# Patient Record
Sex: Male | Born: 1960 | Race: White | Hispanic: No | Marital: Single | Smoking: Current every day smoker
Health system: Southern US, Community
[De-identification: ages and names within clinical notes are randomized; demographics above are authoritative.]

## PROBLEM LIST (undated history)

## (undated) ENCOUNTER — Emergency Department (HOSPITAL_BASED_OUTPATIENT_CLINIC_OR_DEPARTMENT_OTHER): Admission: EM | Payer: 59 | Source: Home / Self Care

## (undated) DIAGNOSIS — E785 Hyperlipidemia, unspecified: Secondary | ICD-10-CM

## (undated) DIAGNOSIS — E559 Vitamin D deficiency, unspecified: Secondary | ICD-10-CM

## (undated) HISTORY — PX: ANKLE SURGERY: SHX546

## (undated) HISTORY — PX: ESOPHAGEAL DILATION: SHX303

## (undated) HISTORY — DX: Vitamin D deficiency, unspecified: E55.9

## (undated) HISTORY — DX: Hyperlipidemia, unspecified: E78.5

## (undated) HISTORY — DX: Other disorders of iron metabolism: E83.19

---

## 2001-04-02 HISTORY — PX: CHOLECYSTECTOMY: SHX55

## 2005-04-02 HISTORY — PX: HIATAL HERNIA REPAIR: SHX195

## 2005-04-02 HISTORY — PX: DENTAL SURGERY: SHX609

## 2010-04-02 LAB — HM COLONOSCOPY

## 2014-09-21 LAB — PSA: PSA: 0.19

## 2015-01-19 LAB — LIPID PANEL
Cholesterol: 160 mg/dL (ref 0–200)
HDL: 44 mg/dL (ref 35–70)
LDL CALC: 101 mg/dL
TRIGLYCERIDES: 77 mg/dL (ref 40–160)

## 2015-01-19 LAB — CBC AND DIFFERENTIAL
HCT: 47 % (ref 41–53)
Hemoglobin: 17.2 g/dL (ref 13.5–17.5)
PLATELETS: 135 10*3/uL — AB (ref 150–399)
WBC: 4.9 10^3/mL

## 2015-01-19 LAB — BASIC METABOLIC PANEL
BUN: 17 mg/dL (ref 4–21)
Creatinine: 1 mg/dL (ref <=1.3)
Glucose: 93 mg/dL
Potassium: 4.4 mmol/L (ref 3.4–5.3)
Sodium: 139 mmol/L (ref 137–147)

## 2015-01-19 LAB — HEPATIC FUNCTION PANEL
ALK PHOS: 41 U/L (ref 25–125)
ALT: 33 U/L (ref 10–40)
AST: 24 U/L (ref 14–40)
BILIRUBIN, TOTAL: 0.6 mg/dL

## 2015-06-17 ENCOUNTER — Telehealth: Payer: Self-pay | Admitting: *Deleted

## 2015-06-17 ENCOUNTER — Encounter: Payer: Self-pay | Admitting: *Deleted

## 2015-06-17 NOTE — Telephone Encounter (Signed)
Pre-Visit Call completed with patient and chart updated.   Pre-Visit Info documented in Specialty Comments under [employer].    

## 2015-06-20 ENCOUNTER — Ambulatory Visit: Payer: Self-pay | Admitting: Family Medicine

## 2015-07-01 ENCOUNTER — Ambulatory Visit: Payer: Self-pay | Admitting: Family Medicine

## 2015-07-07 ENCOUNTER — Encounter: Payer: Self-pay | Admitting: Family Medicine

## 2015-07-07 ENCOUNTER — Telehealth: Payer: Self-pay

## 2015-07-07 ENCOUNTER — Ambulatory Visit (INDEPENDENT_AMBULATORY_CARE_PROVIDER_SITE_OTHER): Payer: 59 | Admitting: Family Medicine

## 2015-07-07 VITALS — BP 118/78 | HR 84 | Temp 98.1°F | Resp 16 | Ht 71.0 in | Wt 221.0 lb

## 2015-07-07 DIAGNOSIS — N529 Male erectile dysfunction, unspecified: Secondary | ICD-10-CM | POA: Diagnosis not present

## 2015-07-07 DIAGNOSIS — M792 Neuralgia and neuritis, unspecified: Secondary | ICD-10-CM | POA: Diagnosis not present

## 2015-07-07 DIAGNOSIS — E785 Hyperlipidemia, unspecified: Secondary | ICD-10-CM | POA: Insufficient documentation

## 2015-07-07 DIAGNOSIS — F17210 Nicotine dependence, cigarettes, uncomplicated: Secondary | ICD-10-CM

## 2015-07-07 DIAGNOSIS — Z720 Tobacco use: Secondary | ICD-10-CM

## 2015-07-07 LAB — BASIC METABOLIC PANEL WITH GFR
BUN: 15 mg/dL (ref 6–23)
CO2: 28 meq/L (ref 19–32)
Calcium: 9.6 mg/dL (ref 8.4–10.5)
Chloride: 103 meq/L (ref 96–112)
Creatinine, Ser: 1.04 mg/dL (ref 0.40–1.50)
GFR: 79.31 mL/min
Glucose, Bld: 94 mg/dL (ref 70–99)
Potassium: 4.2 meq/L (ref 3.5–5.1)
Sodium: 137 meq/L (ref 135–145)

## 2015-07-07 LAB — LIPID PANEL
Cholesterol: 175 mg/dL (ref 0–200)
HDL: 44.8 mg/dL
LDL Cholesterol: 109 mg/dL — ABNORMAL HIGH (ref 0–99)
NonHDL: 129.98
Total CHOL/HDL Ratio: 4
Triglycerides: 104 mg/dL (ref 0.0–149.0)
VLDL: 20.8 mg/dL (ref 0.0–40.0)

## 2015-07-07 LAB — HEPATIC FUNCTION PANEL
ALT: 29 U/L (ref 0–53)
AST: 21 U/L (ref 0–37)
Albumin: 4.5 g/dL (ref 3.5–5.2)
Alkaline Phosphatase: 36 U/L — ABNORMAL LOW (ref 39–117)
Bilirubin, Direct: 0.1 mg/dL (ref 0.0–0.3)
Total Bilirubin: 0.6 mg/dL (ref 0.2–1.2)
Total Protein: 7.3 g/dL (ref 6.0–8.3)

## 2015-07-07 LAB — CBC WITH DIFFERENTIAL/PLATELET
Basophils Absolute: 0 10*3/uL (ref 0.0–0.1)
Basophils Relative: 0.4 % (ref 0.0–3.0)
Eosinophils Absolute: 0.3 10*3/uL (ref 0.0–0.7)
Eosinophils Relative: 5.1 % — ABNORMAL HIGH (ref 0.0–5.0)
HCT: 49.7 % (ref 39.0–52.0)
Hemoglobin: 17.3 g/dL — ABNORMAL HIGH (ref 13.0–17.0)
Lymphocytes Relative: 44.8 % (ref 12.0–46.0)
Lymphs Abs: 2.4 10*3/uL (ref 0.7–4.0)
MCHC: 34.7 g/dL (ref 30.0–36.0)
MCV: 94.4 fl (ref 78.0–100.0)
Monocytes Absolute: 0.4 10*3/uL (ref 0.1–1.0)
Monocytes Relative: 7.4 % (ref 3.0–12.0)
Neutro Abs: 2.3 10*3/uL (ref 1.4–7.7)
Neutrophils Relative %: 42.3 % — ABNORMAL LOW (ref 43.0–77.0)
Platelets: 147 10*3/uL — ABNORMAL LOW (ref 150.0–400.0)
RBC: 5.27 Mil/uL (ref 4.22–5.81)
RDW: 13.2 % (ref 11.5–15.5)
WBC: 5.4 10*3/uL (ref 4.0–10.5)

## 2015-07-07 LAB — IBC PANEL
Iron: 152 ug/dL (ref 42–165)
Saturation Ratios: 41.6 % (ref 20.0–50.0)
Transferrin: 261 mg/dL (ref 212.0–360.0)

## 2015-07-07 LAB — TSH: TSH: 2 u[IU]/mL (ref 0.35–4.50)

## 2015-07-07 MED ORDER — TADALAFIL 5 MG PO TABS: 5.0000 mg | ORAL_TABLET | Freq: Every day | ORAL | Status: DC | PRN

## 2015-07-07 NOTE — Assessment & Plan Note (Signed)
New to provider, ongoing for pt.  Not currently having serial phlebotomy.  Reports he was told to follow a low carb, low fat diet by Hematology.  Check labs and determine if further intervention is needed

## 2015-07-07 NOTE — Progress Notes (Signed)
   Subjective:    Patient ID: Daniel Yang List, male    DOB: 1960/11/02, 55 y.o.   MRN: [id number]  HPI New to establish.  Previous MD- Littie DeedsGentry  UTD on colonoscopy- done 2013 by Dr Brennan BaileyMark Smith  ED- chronic problem, on Cialis but this is very expensive for pt.  Took Viagra in the past but Cialis is much more effective.  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  On Gabapentin 3 tabs as needed.  Pt reports good relief w/ sxs w/ the medication.  Hemochromatosis- chronic problem, pt has hx of elevated iron.  Per report 'was up to 577'.  Pt saw a hematologist who 'put me on a low carb, low fat diet' which caused levels to 'drop 50 points'.  Pt is now donating blood through the [facility].  Denies dizziness, nausea.  Did have 'a spell with my vision' but has an eye exam on 4/11.  Hyperlipidemia- noted in previous records.  Pt has never been on medication.  Denies CP, SOB, HAs, abd pain, N/V.  Tobacco use- smoking a pack daily for 35 yrs.   Review of Systems For ROS see HPI     Objective:   Physical Exam  Constitutional: He is oriented to person, place, and time. He appears well-developed and well-nourished. No distress.  HENT:  Head: Normocephalic and atraumatic.  Eyes: Conjunctivae and EOM are normal. Pupils are equal, round, and reactive to light.  Neck: Normal range of motion. Neck supple. No thyromegaly present.  Cardiovascular: Normal rate, regular rhythm, normal heart sounds and intact distal pulses.   No murmur heard. Pulmonary/Chest: Effort normal and breath sounds normal. No respiratory distress.  Abdominal: Soft. Bowel sounds are normal. He exhibits no distension.  Musculoskeletal: He exhibits no edema.  Lymphadenopathy:    He has no cervical adenopathy.  Neurological: He is alert and oriented to person, place, and time. No cranial nerve deficit.  Skin: Skin is warm and dry.  Psychiatric: He has a normal mood and affect. His behavior is normal.  Vitals reviewed.         Assessment & Plan:

## 2015-07-07 NOTE — Assessment & Plan Note (Signed)
New to provider, ongoing for pt.  He reports sxs are bilateral in his arms and occur mostly at night while lying down but he gets near complete relief w/ Gabapentin PRN.  No further work up at this time

## 2015-07-07 NOTE — Patient Instructions (Signed)
Schedule your complete physical in 6 months We'll notify you of your lab results and make any changes if needed Try and quit smoking!!! Continue to make healthy food choices and get regular exercise- you can do it! We'll try and get you a better deal on Cialis Call with any questions or concerns Welcome!  We're glad to have you!!!

## 2015-07-07 NOTE — Telephone Encounter (Signed)
Spoke with Josh at [facility] [phone], Cialis 5 mg tablets are denied, there is nothing on formulary, all ED drugs require P.A. , chance of getting approved is not likely as approval normally only happens with diagnoses BPH, informed Dr. Beverely Lowabori of denial

## 2015-07-07 NOTE — Assessment & Plan Note (Signed)
New to provider, ongoing for pt.  He has been a 1 ppd smoker x35 yrs.  Encouraged him to quit- he states he will try.

## 2015-07-07 NOTE — Progress Notes (Signed)
Pre visit review using our clinic review tool, if applicable. No additional management support is needed unless otherwise documented below in the visit note. 

## 2015-07-07 NOTE — Assessment & Plan Note (Signed)
New to provider, noted in previous PCPs chart.  Pt was unaware of this.  Check labs.  Start meds prn.

## 2015-07-07 NOTE — Assessment & Plan Note (Signed)
New to provider, ongoing for pt.  He reports the medication is very expensive but works better than Viagra (which he was on previously).  Will refill and provided coupon for him.  Will follow.

## 2015-07-08 ENCOUNTER — Encounter: Payer: Self-pay | Admitting: General Practice

## 2015-07-11 ENCOUNTER — Other Ambulatory Visit: Payer: Self-pay | Admitting: General Practice

## 2015-07-11 MED ORDER — TADALAFIL 5 MG PO TABS: 5.0000 mg | ORAL_TABLET | Freq: Every day | ORAL | Status: AC | PRN

## 2015-07-11 MED FILL — CIALIS 5 MG TABLET: 5 | 30 days supply | Qty: 30 | Fill #0

## 2015-07-12 ENCOUNTER — Telehealth: Payer: Self-pay | Admitting: Family Medicine

## 2015-07-12 NOTE — Telephone Encounter (Signed)
Pt calling for lab results.

## 2015-07-12 NOTE — Telephone Encounter (Signed)
Called pt and LMOVM to return call.  °

## 2015-07-13 ENCOUNTER — Telehealth: Payer: Self-pay | Admitting: General Practice

## 2015-07-13 ENCOUNTER — Encounter: Payer: Self-pay | Admitting: General Practice

## 2015-07-13 DIAGNOSIS — G453 Amaurosis fugax: Secondary | ICD-10-CM

## 2015-07-13 DIAGNOSIS — K4090 Unilateral inguinal hernia, without obstruction or gangrene, not specified as recurrent: Secondary | ICD-10-CM

## 2015-07-13 NOTE — Telephone Encounter (Signed)
Spoke with Dr. Iran PlanasJames Pope with [facility] in [place], [place]. He advised that patient needs to have carotid studies completed to rule out Amarosis Fugax vs. Opthalmic Migraine. Their office advised that this was not an urgent request. Please advise on the imaging you want completed.   Fax results to [phone]

## 2015-07-13 NOTE — Telephone Encounter (Signed)
Pt informed of the results and expressed an understanding.

## 2015-07-18 ENCOUNTER — Ambulatory Visit (HOSPITAL_BASED_OUTPATIENT_CLINIC_OR_DEPARTMENT_OTHER)
Admission: RE | Admit: 2015-07-18 | Discharge: 2015-07-18 | Disposition: A | Discharge status: Home / Self Care | Payer: 59 | Source: Ambulatory Visit | Attending: Family Medicine | Admitting: Family Medicine

## 2015-07-18 DIAGNOSIS — G453 Amaurosis fugax: Secondary | ICD-10-CM | POA: Diagnosis not present

## 2015-07-18 DIAGNOSIS — I6523 Occlusion and stenosis of bilateral carotid arteries: Secondary | ICD-10-CM | POA: Insufficient documentation

## 2015-07-18 NOTE — Telephone Encounter (Signed)
Ok to get bilateral carotid dopplers (dx Amarosis Fugax)

## 2015-07-18 NOTE — Telephone Encounter (Signed)
Referral placed.

## 2015-07-18 NOTE — Telephone Encounter (Signed)
Ok to refer to surgery for evaluation of possible R inguinal hernia

## 2015-07-18 NOTE — Telephone Encounter (Signed)
Pt notified and prefers that the carotids be completed at the med center.   Also pt states that he feels as though he has a hernia coming up on the right side. He previously underwent hernia repair on the left side. Please advise   Dr. Brennan BaileyMark Smith in [place]

## 2015-07-19 ENCOUNTER — Encounter: Payer: Self-pay | Admitting: Family Medicine

## 2015-08-18 MED FILL — CIALIS 5 MG TABLET: 5 | 30 days supply | Qty: 30 | Fill #1

## 2015-09-12 LAB — HM COLONOSCOPY

## 2015-09-14 ENCOUNTER — Encounter: Payer: Self-pay | Admitting: General Practice

## 2015-09-22 MED FILL — OMEPRAZOLE DR 20 MG CAPSULE: 20 | 30 days supply | Qty: 30 | Fill #0

## 2015-10-12 ENCOUNTER — Telehealth: Payer: Self-pay | Admitting: Family Medicine

## 2015-10-12 ENCOUNTER — Telehealth: Payer: Self-pay | Admitting: General Practice

## 2015-10-12 NOTE — Telephone Encounter (Signed)
Called pt and advised that we do not have any cialis coupons in the office but he can print them offline. Pt stated an agreement and will look when he gets home.

## 2015-10-12 NOTE — Telephone Encounter (Signed)
Pt calling asking if he could get another coupon for cialis

## 2015-10-12 NOTE — Telephone Encounter (Signed)
PA for Cialis began today with Cover my meds (key #[id number])

## 2015-10-17 ENCOUNTER — Encounter: Payer: Self-pay | Admitting: Family Medicine

## 2015-10-19 NOTE — Telephone Encounter (Signed)
Nothing to do if pt does not have BPH sxs

## 2015-10-19 NOTE — Telephone Encounter (Signed)
FYI.   PA for Cialis was denied, [employer] called today and was unable to give me any alternative therapy for patient. They stated pt wanted to proceed with appeals process but also advised that as long as pt does not have a diagnosis of BPH any medications for Erectile Dysfunction would not be approved. [employer] representative states he was going to advise pt of this response.

## 2015-10-25 MED FILL — OMEPRAZOLE DR 20 MG CAPSULE: 20 | 30 days supply | Qty: 30 | Fill #1

## 2015-11-11 ENCOUNTER — Encounter: Payer: Self-pay | Admitting: Family Medicine

## 2015-11-11 ENCOUNTER — Ambulatory Visit (HOSPITAL_BASED_OUTPATIENT_CLINIC_OR_DEPARTMENT_OTHER)
Admission: RE | Admit: 2015-11-11 | Discharge: 2015-11-11 | Disposition: A | Discharge status: Home / Self Care | Payer: 59 | Source: Ambulatory Visit | Attending: Family Medicine | Admitting: Family Medicine

## 2015-11-11 ENCOUNTER — Ambulatory Visit (INDEPENDENT_AMBULATORY_CARE_PROVIDER_SITE_OTHER): Payer: 59 | Admitting: Family Medicine

## 2015-11-11 ENCOUNTER — Encounter: Payer: Self-pay | Admitting: General Practice

## 2015-11-11 VITALS — BP 132/82 | HR 78 | Temp 98.1°F | Resp 16 | Ht 71.0 in | Wt 227.1 lb

## 2015-11-11 DIAGNOSIS — M25532 Pain in left wrist: Secondary | ICD-10-CM

## 2015-11-11 DIAGNOSIS — G2581 Restless legs syndrome: Secondary | ICD-10-CM | POA: Diagnosis not present

## 2015-11-11 DIAGNOSIS — M19032 Primary osteoarthritis, left wrist: Secondary | ICD-10-CM | POA: Insufficient documentation

## 2015-11-11 DIAGNOSIS — H9313 Tinnitus, bilateral: Secondary | ICD-10-CM

## 2015-11-11 MED ORDER — ROPINIROLE HCL 0.5 MG PO TABS: 0.5000 mg | ORAL_TABLET | Freq: Every day | ORAL | 3 refills | Status: AC

## 2015-11-11 MED FILL — rOPINIRole HCL 0.5 MG TABS: 0.5 | 30 days supply | Qty: 30 | Fill #0

## 2015-11-11 NOTE — Patient Instructions (Signed)
Follow up as needed [facility] [address] [facility] [address] and get your xray done Tylenol/ibuprofen as needed for pain, Ice for swelling and discomfort Start Claritin or Zyrtec daily to see if the ringing in the ears improve.  If no improvement after 2 weeks, please let me know so we can refer to ENT Start the Requip nightly for the restless legs.  Start w/ 1/2 tab nightly and if no improvement after 4 days, increase to the whole tab nightly Call with any questions or concerns Hang in there!!!

## 2015-11-11 NOTE — Progress Notes (Signed)
Pre visit review using our clinic review tool, if applicable. No additional management support is needed unless otherwise documented below in the visit note. 

## 2015-11-11 NOTE — Progress Notes (Signed)
   Subjective:    Patient ID: Daniel Yang, male    DOB: [birth year], 55 y.o.   MRN: [id number]  HPI Tinnitus- pt reports ringing in both ears for ~1 month.  Pt reports ringing is getting louder rather than improving.  No recent noise exposure or injury.  No associated dizziness.  No decreased hearing.  L wrist pain- pt got wrist 'slammed in door' on Tuesday AM.  Reports area is sore and swollen.  RLS- pt has painful legs at night which require him to get up an move.  Pt describes it as a 'squeezing' but it 'doesn't get hard like a leg cramp'.  sxs started a couple of years ago.   Review of Systems For ROS see HPI     Objective:   Physical Exam  Constitutional: He is oriented to person, place, and time. He appears well-developed and well-nourished. No distress.  HENT:  Head: Normocephalic and atraumatic.  No TTP over sinuses + turbinate edema + PND TMs normal bilaterally  Eyes: Conjunctivae and EOM are normal. Pupils are equal, round, and reactive to light.  Neck: Normal range of motion. Neck supple.  Cardiovascular: Normal rate, regular rhythm, normal heart sounds and intact distal pulses.   Pulmonary/Chest: Effort normal and breath sounds normal. No respiratory distress. He has no wheezes.  Musculoskeletal: He exhibits edema (mild swelling over L 1st CMC joint) and tenderness (TTP over L 1st CMC joint, TTP over snuffbox). He exhibits no deformity.  Lymphadenopathy:    He has no cervical adenopathy.  Neurological: He is alert and oriented to person, place, and time.  Skin: Skin is warm and dry.  Vitals reviewed.         Assessment & Plan:  Tinnitus- new.  Suspect this may be related to untreated seasonal allergies.  Start daily antihistamine.  If no improvement, refer to ENT for complete evaluation and tx.  Pt expressed understanding and is in agreement w/ plan.   L wrist pain- due to TTP over snuffbox, get imaging to assess.  Mechanism of injury is unlikely for fracture but  must be sure.  NSAIDs, ice for pain relief.  Reviewed supportive care and red flags that should prompt return.  Pt expressed understanding and is in agreement w/ plan.   RLS- pt's nocturnal leg sxs consistent w/ RLS.  Start low dose Requip and monitor for improvement.  Pt expressed understanding and is in agreement w/ plan.

## 2015-11-14 ENCOUNTER — Other Ambulatory Visit: Payer: Self-pay | Admitting: Family Medicine

## 2015-11-14 DIAGNOSIS — S62502A Fracture of unspecified phalanx of left thumb, initial encounter for closed fracture: Secondary | ICD-10-CM

## 2015-11-14 DIAGNOSIS — M25532 Pain in left wrist: Secondary | ICD-10-CM

## 2015-11-15 ENCOUNTER — Encounter: Payer: Self-pay | Admitting: Family Medicine

## 2015-11-17 MED FILL — MELOXICAM 15 MG TABLET: 15 | 30 days supply | Qty: 30 | Fill #0

## 2015-11-28 ENCOUNTER — Telehealth: Payer: Self-pay | Admitting: Family Medicine

## 2015-11-28 MED ORDER — GABAPENTIN 100 MG PO CAPS: ORAL_CAPSULE | ORAL | 3 refills | Status: AC

## 2015-11-28 MED FILL — GABAPENTIN 100 MG CAPSULE: 100 | 10 days supply | Qty: 90 | Fill #0

## 2015-11-28 NOTE — Telephone Encounter (Signed)
Pt needs refill on gabapentin, [facility].

## 2015-11-28 NOTE — Telephone Encounter (Signed)
Medication filled to pharmacy as requested.   

## 2015-11-29 MED FILL — OMEPRAZOLE DR 20 MG CAPSULE: 20 | 30 days supply | Qty: 30 | Fill #2

## 2016-01-24 MED FILL — rOPINIRole HCL 0.5 MG TABS: 0.5 | 30 days supply | Qty: 30 | Fill #1

## 2016-05-22 ENCOUNTER — Encounter: Payer: Self-pay | Admitting: General Practice

## 2016-05-30 MED FILL — rOPINIRole HCL 0.5 MG TABS: 0.5 | 30 days supply | Qty: 30 | Fill #2

## 2016-05-30 MED FILL — MELOXICAM 15 MG TABLET: 15 | 30 days supply | Qty: 30 | Fill #1

## 2016-05-30 MED FILL — GABAPENTIN 100 MG CAP: 100 | 10 days supply | Qty: 90 | Fill #1

## 2016-11-15 MED FILL — GABAPENTIN 100 MG CAPSULE: 100 | 10 days supply | Qty: 90 | Fill #2

## 2017-08-20 ENCOUNTER — Encounter: Payer: Self-pay | Admitting: General Practice

## 2018-07-06 IMAGING — DX DG WRIST COMPLETE 3+V*L*
4 series · 4 of 4 positions shown · non-contrast
Comparison: None.

CLINICAL DATA: Snuffbox pain in the left wrist after recent injury.

EXAM:
LEFT WRIST - COMPLETE 3+ VIEW

[wrist pa]
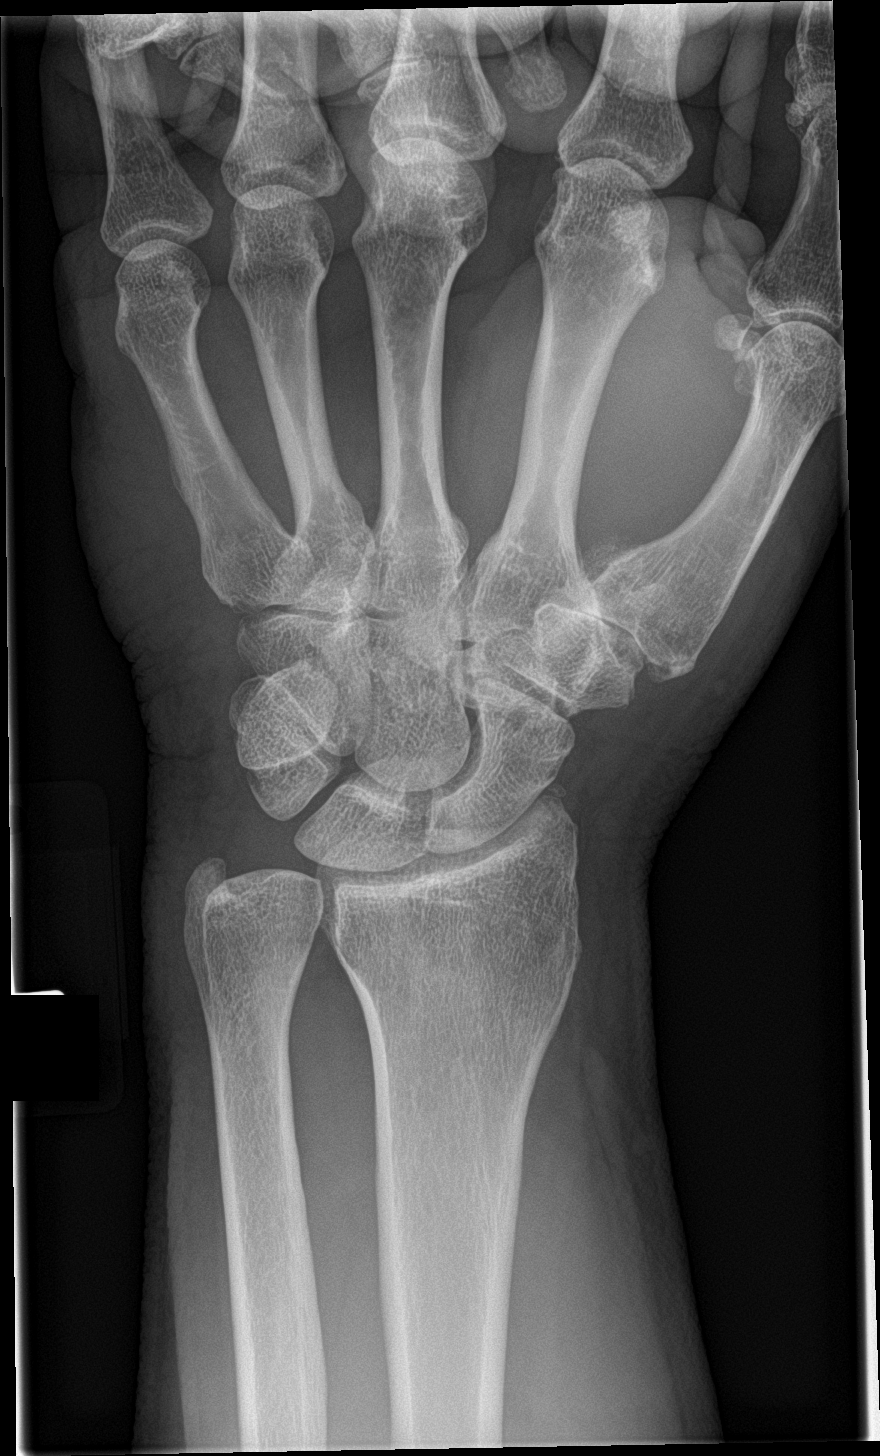

[wrist obl]
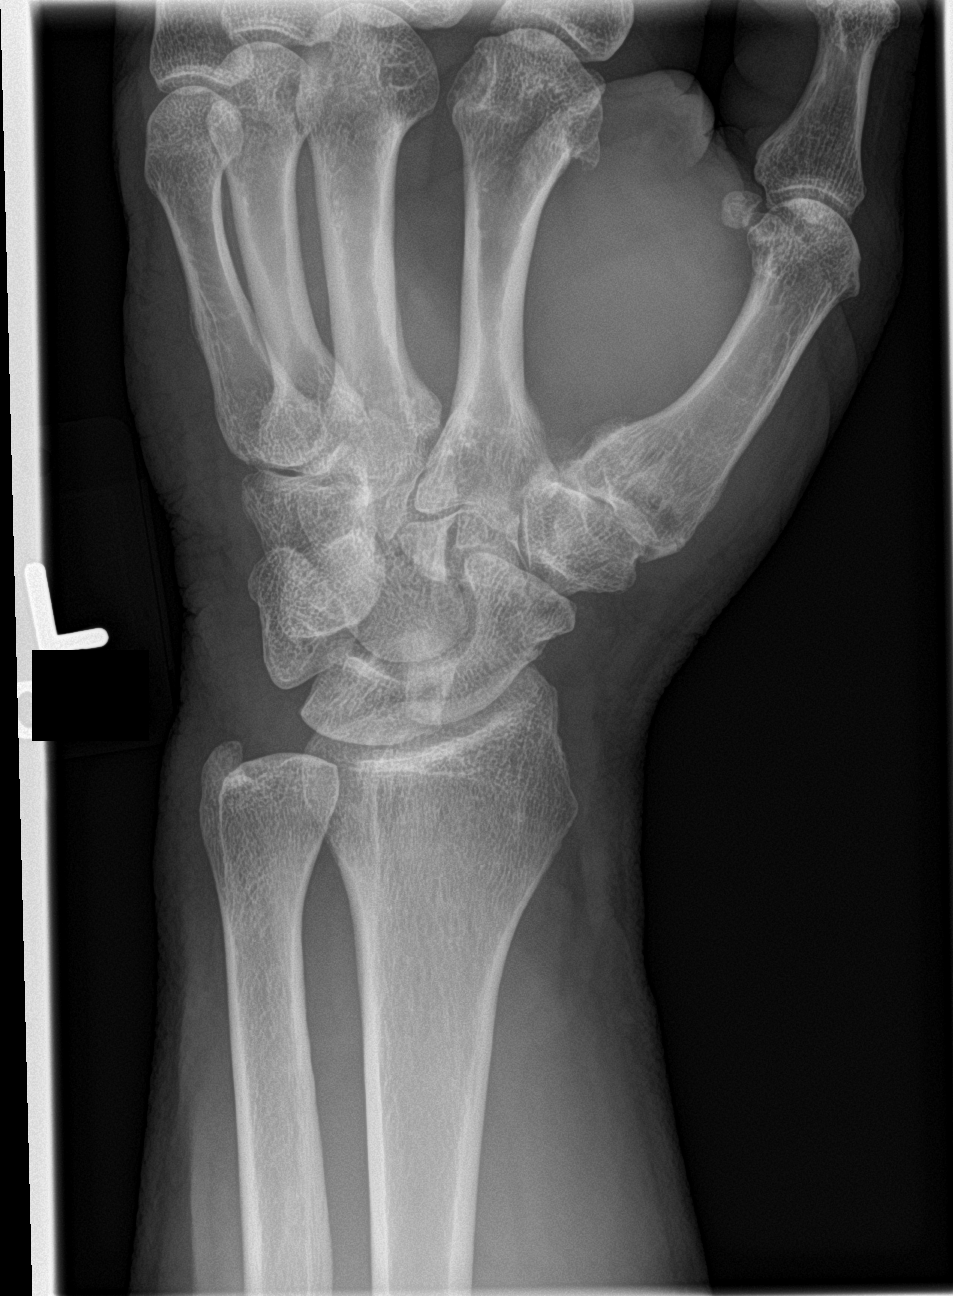

[wrist lat]
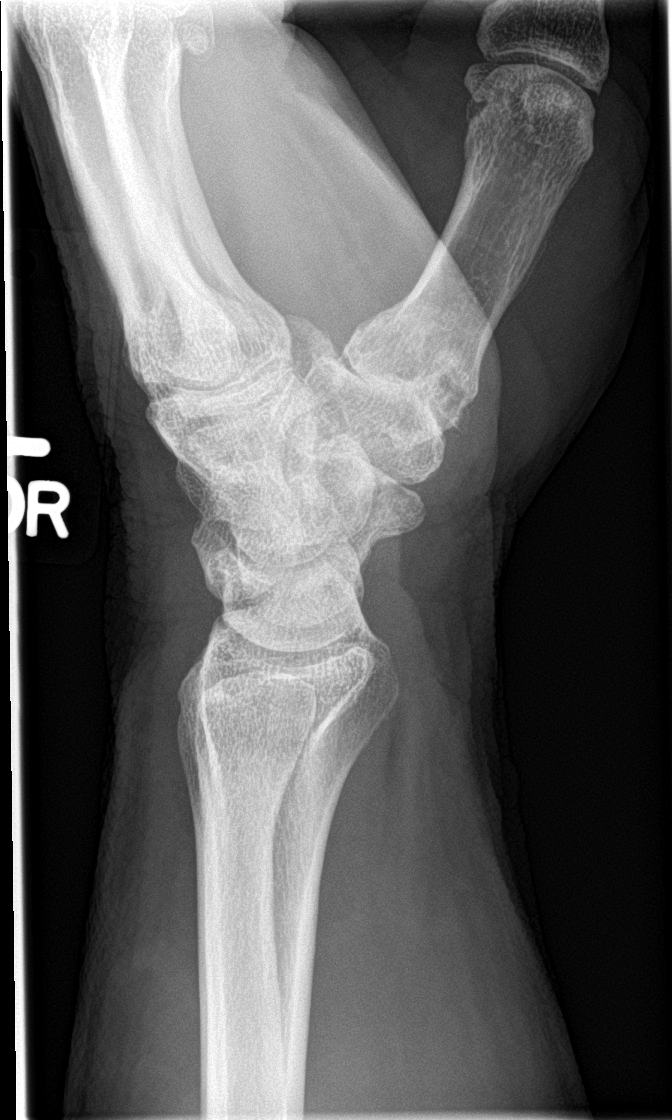

[wrist navicular]
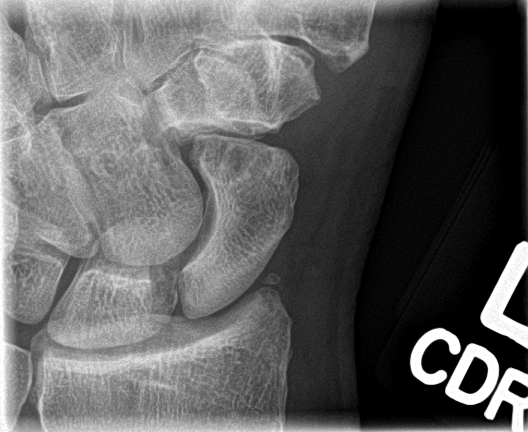

[4 of 4 positions shown; findings below may reference images not displayed]

FINDINGS: There is slight lucency and cortical irregularity at the radial base
of the first metacarpal, favored to represent degenerative change
given the osteoarthritis present at the first carpometacarpal joint,
although a nondisplaced fracture is difficult to exclude. No
additional potential fracture. No dislocation. Moderate
osteoarthritis at the first carpometacarpal joint. No appreciable
erosive arthropathy. No radiopaque foreign body.
IMPRESSION: Slight cortical irregularity and lucency at the radial base of the
first metacarpal, favored to represent degenerative change given the
osteoarthritis present at the first carpometacarpal joint, although
a nondisplaced fracture is difficult to exclude. Follow-up
radiographs or MRI could be obtained as clinically warranted.
